# Patient Record
Sex: Female | Born: 1990 | Race: White | Hispanic: No | Marital: Single | Smoking: Never smoker
Health system: Southern US, Community
[De-identification: ages and names within clinical notes are randomized; demographics above are authoritative.]

---

## 2018-11-06 ENCOUNTER — Encounter (HOSPITAL_COMMUNITY): Payer: Self-pay | Admitting: Family Medicine

## 2018-11-06 ENCOUNTER — Emergency Department (HOSPITAL_COMMUNITY): Payer: Medicare Other

## 2018-11-06 ENCOUNTER — Emergency Department (HOSPITAL_COMMUNITY)
Admission: EM | Admit: 2018-11-06 | Discharge: 2018-11-06 | Disposition: A | Discharge status: Home / Self Care | Payer: Medicare Other | Attending: Emergency Medicine | Admitting: Emergency Medicine

## 2018-11-06 DIAGNOSIS — R42 Dizziness and giddiness: Secondary | ICD-10-CM | POA: Insufficient documentation

## 2018-11-06 DIAGNOSIS — R1084 Generalized abdominal pain: Secondary | ICD-10-CM

## 2018-11-06 DIAGNOSIS — R519 Headache, unspecified: Secondary | ICD-10-CM

## 2018-11-06 DIAGNOSIS — R110 Nausea: Secondary | ICD-10-CM | POA: Diagnosis not present

## 2018-11-06 DIAGNOSIS — R51 Headache: Secondary | ICD-10-CM | POA: Insufficient documentation

## 2018-11-06 LAB — CBC WITH DIFFERENTIAL/PLATELET
Abs Immature Granulocytes: 0.02 10*3/uL (ref 0.00–0.07)
Basophils Absolute: 0.1 10*3/uL (ref 0.0–0.1)
Basophils Relative: 0 %
Eosinophils Absolute: 0 10*3/uL (ref 0.0–0.5)
Eosinophils Relative: 0 %
HCT: 44 % (ref 36.0–46.0)
Hemoglobin: 14.3 g/dL (ref 12.0–15.0)
Immature Granulocytes: 0 %
Lymphocytes Relative: 36 %
Lymphs Abs: 4.2 10*3/uL — ABNORMAL HIGH (ref 0.7–4.0)
MCH: 30 pg (ref 26.0–34.0)
MCHC: 32.5 g/dL (ref 30.0–36.0)
MCV: 92.4 fL (ref 80.0–100.0)
Monocytes Absolute: 0.7 10*3/uL (ref 0.1–1.0)
Monocytes Relative: 6 %
Neutro Abs: 6.6 10*3/uL (ref 1.7–7.7)
Neutrophils Relative %: 58 %
Platelets: 316 10*3/uL (ref 150–400)
RBC: 4.76 MIL/uL (ref 3.87–5.11)
RDW: 13 % (ref 11.5–15.5)
WBC: 11.6 10*3/uL — ABNORMAL HIGH (ref 4.0–10.5)
nRBC: 0 % (ref 0.0–0.2)

## 2018-11-06 LAB — BASIC METABOLIC PANEL WITH GFR
Anion gap: 9 (ref 5–15)
BUN: 12 mg/dL (ref 6–20)
CO2: 22 mmol/L (ref 22–32)
Calcium: 9.6 mg/dL (ref 8.9–10.3)
Chloride: 105 mmol/L (ref 98–111)
Creatinine, Ser: 0.5 mg/dL (ref 0.44–1.00)
GFR calc Af Amer: >60 mL/min
GFR calc non Af Amer: >60 mL/min
Glucose, Bld: 83 mg/dL (ref 70–99)
Potassium: 3.8 mmol/L (ref 3.5–5.1)
Sodium: 136 mmol/L (ref 135–145)

## 2018-11-06 LAB — I-STAT CHEM 8, ED
BUN: 11 mg/dL (ref 6–20)
Calcium, Ion: 1.26 mmol/L (ref 1.15–1.40)
Chloride: 105 mmol/L (ref 98–111)
Creatinine, Ser: 0.5 mg/dL (ref 0.44–1.00)
Glucose, Bld: 81 mg/dL (ref 70–99)
HCT: 43 % (ref 36.0–46.0)
Hemoglobin: 14.6 g/dL (ref 12.0–15.0)
Potassium: 4.1 mmol/L (ref 3.5–5.1)
Sodium: 138 mmol/L (ref 135–145)
TCO2: 26 mmol/L (ref 22–32)

## 2018-11-06 LAB — I-STAT BETA HCG BLOOD, ED (MC, WL, AP ONLY): I-stat hCG, quantitative: <5 m[IU]/mL (ref <5)

## 2018-11-06 MED ORDER — IOHEXOL 300 MG/ML  SOLN: 100.0000 mL | Freq: Once | INTRAMUSCULAR | Status: DC | PRN

## 2018-11-06 MED ORDER — MORPHINE SULFATE (PF) 4 MG/ML IV SOLN
4.0000 mg | Freq: Once | INTRAVENOUS | Status: AC
  Administered 2018-11-06: 21:00:00 4 mg via INTRAVENOUS
  Filled 2018-11-06: qty 1

## 2018-11-06 MED ORDER — PROMETHAZINE HCL 25 MG/ML IJ SOLN
12.5000 mg | Freq: Once | INTRAMUSCULAR | Status: AC
  Administered 2018-11-06: 12.5 mg via INTRAVENOUS
  Filled 2018-11-06: qty 1

## 2018-11-06 MED ORDER — SODIUM CHLORIDE (PF) 0.9 % IJ SOLN
INTRAMUSCULAR | Status: AC
  Filled 2018-11-06: qty 50

## 2018-11-06 NOTE — ED Notes (Signed)
Below order not completed by EW. 
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
## 2018-11-06 NOTE — ED Notes (Signed)
Pt came to nurses station c/o of pain, burning, and itching at IV site IV site red and swollen Pt request IV to be removed IV: 20G Right FA removed Pt given ice pack to place on IV site
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
